# Patient Record
Sex: Male | Born: 1970 | Race: Black or African American | Hispanic: No | Marital: Single | Smoking: Never smoker
Health system: Southern US, Community
[De-identification: ages and names within clinical notes are randomized; demographics above are authoritative.]

## PROBLEM LIST (undated history)

## (undated) ENCOUNTER — Emergency Department (HOSPITAL_COMMUNITY): Admission: EM | Payer: Self-pay | Source: Home / Self Care | Attending: Family Medicine | Admitting: Family Medicine

---

## 1999-08-01 ENCOUNTER — Ambulatory Visit (HOSPITAL_COMMUNITY): Admission: RE | Admit: 1999-08-01 | Discharge: 1999-08-01 | Payer: Self-pay | Admitting: Family Medicine

## 1999-08-01 ENCOUNTER — Encounter: Payer: Self-pay | Admitting: Family Medicine

## 2009-01-16 ENCOUNTER — Emergency Department (HOSPITAL_COMMUNITY): Admission: EM | Admit: 2009-01-16 | Discharge: 2009-01-16 | Payer: Self-pay | Admitting: Family Medicine

## 2011-03-22 ENCOUNTER — Emergency Department (HOSPITAL_COMMUNITY): Payer: Self-pay

## 2011-03-22 ENCOUNTER — Emergency Department (HOSPITAL_COMMUNITY)
Admission: EM | Admit: 2011-03-22 | Discharge: 2011-03-22 | Disposition: A | Discharge status: Home / Self Care | Payer: Self-pay | Attending: Emergency Medicine | Admitting: Emergency Medicine

## 2011-03-22 ENCOUNTER — Inpatient Hospital Stay (INDEPENDENT_AMBULATORY_CARE_PROVIDER_SITE_OTHER)
Admission: RE | Admit: 2011-03-22 | Discharge: 2011-03-22 | Disposition: A | Discharge status: Home / Self Care | Payer: Self-pay | Source: Ambulatory Visit | Attending: Family Medicine | Admitting: Family Medicine

## 2011-03-22 DIAGNOSIS — R51 Headache: Secondary | ICD-10-CM | POA: Insufficient documentation

## 2011-03-22 DIAGNOSIS — R42 Dizziness and giddiness: Secondary | ICD-10-CM | POA: Insufficient documentation

## 2011-03-22 DIAGNOSIS — S065X9A Traumatic subdural hemorrhage with loss of consciousness of unspecified duration, initial encounter: Secondary | ICD-10-CM | POA: Insufficient documentation

## 2011-03-22 DIAGNOSIS — S060X9A Concussion with loss of consciousness of unspecified duration, initial encounter: Secondary | ICD-10-CM

## 2011-03-22 DIAGNOSIS — S06309A Unspecified focal traumatic brain injury with loss of consciousness of unspecified duration, initial encounter: Secondary | ICD-10-CM | POA: Insufficient documentation

## 2011-03-22 LAB — DIFFERENTIAL
Basophils Absolute: 0 10*3/uL (ref 0.0–0.1)
Basophils Relative: 0 % (ref 0–1)
Eosinophils Absolute: 0 10*3/uL (ref 0.0–0.7)
Eosinophils Relative: 0 % (ref 0–5)
Lymphocytes Relative: 23 % (ref 12–46)
Lymphs Abs: 2 10*3/uL (ref 0.7–4.0)
Monocytes Absolute: 0.6 10*3/uL (ref 0.1–1.0)
Monocytes Relative: 7 % (ref 3–12)
Neutro Abs: 6.1 10*3/uL (ref 1.7–7.7)
Neutrophils Relative %: 69 % (ref 43–77)

## 2011-03-22 LAB — COMPREHENSIVE METABOLIC PANEL
Albumin: 4.1 g/dL (ref 3.5–5.2)
Alkaline Phosphatase: 32 U/L — ABNORMAL LOW (ref 39–117)
BUN: 12 mg/dL (ref 6–23)
Creatinine, Ser: 1.03 mg/dL (ref 0.50–1.35)
GFR calc Af Amer: >60 mL/min (ref >60)
Glucose, Bld: 96 mg/dL (ref 70–99)
Total Bilirubin: 0.9 mg/dL (ref 0.3–1.2)
Total Protein: 8.2 g/dL (ref 6.0–8.3)

## 2011-03-22 LAB — CBC
HCT: 43.4 % (ref 39.0–52.0)
Hemoglobin: 15.3 g/dL (ref 13.0–17.0)
MCH: 28.4 pg (ref 26.0–34.0)
MCHC: 35.3 g/dL (ref 30.0–36.0)
MCV: 80.7 fL (ref 78.0–100.0)
Platelets: 275 10*3/uL (ref 150–400)
RBC: 5.38 MIL/uL (ref 4.22–5.81)
RDW: 13.6 % (ref 11.5–15.5)
WBC: 8.8 10*3/uL (ref 4.0–10.5)

## 2011-03-25 ENCOUNTER — Other Ambulatory Visit (HOSPITAL_COMMUNITY): Payer: Self-pay | Admitting: Emergency Medicine

## 2011-03-25 DIAGNOSIS — S0990XA Unspecified injury of head, initial encounter: Secondary | ICD-10-CM

## 2011-04-03 ENCOUNTER — Ambulatory Visit (HOSPITAL_COMMUNITY)
Admission: RE | Admit: 2011-04-03 | Discharge: 2011-04-03 | Disposition: A | Discharge status: Home / Self Care | Payer: Self-pay | Source: Ambulatory Visit | Attending: Emergency Medicine | Admitting: Emergency Medicine

## 2011-04-03 ENCOUNTER — Other Ambulatory Visit (HOSPITAL_COMMUNITY): Payer: Self-pay | Admitting: Emergency Medicine

## 2011-04-03 DIAGNOSIS — I6200 Nontraumatic subdural hemorrhage, unspecified: Secondary | ICD-10-CM | POA: Insufficient documentation

## 2011-04-03 DIAGNOSIS — S0990XA Unspecified injury of head, initial encounter: Secondary | ICD-10-CM

## 2011-10-05 ENCOUNTER — Ambulatory Visit: Payer: Self-pay | Admitting: Family Medicine

## 2011-10-05 VITALS — BP 117/74 | HR 64 | Temp 98.1°F | Resp 16 | Ht 68.0 in | Wt 191.0 lb

## 2011-10-05 DIAGNOSIS — Z0289 Encounter for other administrative examinations: Secondary | ICD-10-CM

## 2011-10-05 DIAGNOSIS — Z021 Encounter for pre-employment examination: Secondary | ICD-10-CM

## 2011-10-05 NOTE — Progress Notes (Signed)
  [facility]:  Office Visit  Chief Complaint:  Chief Complaint  Patient presents with  . Employment Physical    HPI: Arthur Aguirre is a 41 y.o. male who complains of  Here for a pre-employment physical. 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 Denies  CP, SOB wheezing with activites. Exercises regular 6x/week. No weakness, numbness, tingling, back issues or mental health issues.   History reviewed. No pertinent past medical history. History reviewed. No pertinent past surgical history. History   Social History  . Marital Status: Single    Spouse Name: N/A    Number of Children: N/A  . Years of Education: N/A   Social History Main Topics  . Smoking status: Never Smoker   . Smokeless tobacco: None  . Alcohol Use: Yes     social  . Drug Use: No  . Sexually Active: None   Other Topics Concern  . None   Social History Narrative  . None   Family History  Problem Relation Age of Onset  . Cancer Mother    Allergies no known allergies Prior to Admission medications   Not on File     ROS: The patient denies fevers, chills, night sweats, unintentional weight loss, chest pain, palpitations, wheezing, dyspnea on exertion, nausea, vomiting, abdominal pain, dysuria, hematuria, melena, numbness, weakness, or tingling.  Denies any thoughts of SI/HI.   All other systems have been reviewed and were otherwise negative with the exception of those mentioned in the HPI and as above.    PHYSICAL EXAM: Filed Vitals:   10/05/11 0955  BP: 117/74  Pulse: 64  Temp: 98.1 F (36.7 C)  Resp: 16   Filed Vitals:   10/05/11 0955  Height: 5\' 8"  (1.727 m)  Weight: 191 lb (86.637 kg)   Body mass index is 29.04 kg/(m^2).  General: Alert, no acute distress.  HEENT:  Normocephalic, atraumatic, oropharynx patent. TM nl. No exudates. PERRLA, EOMI Cardiovascular:  Regular rate and rhythm, no rubs murmurs or gallops.  No Carotid bruits,  radial pulse intact. No pedal edema.  Respiratory: Clear to auscultation bilaterally.  No wheezes, rales, or rhonchi.  No cyanosis, no use of accessory musculature GI: No organomegaly, abdomen is soft and non-tender, positive bowel sounds.  No masses. Skin: No rashes. Neurologic: Facial musculature symmetric. Psychiatric: Patient is appropriate throughout our interaction.  Lymphatic: No cervical lymphadenopathy Musculoskeletal: Gait intact. Msk normal. No scoliosis. 5/5 strength. 2/2 DTRs   LABS:  EKG/XRAY:   Primary read interpreted by Dr. Conley Rolls at [facility].   ASSESSMENT/PLAN: Encounter Diagnosis  Name Primary?  . Physical exam, pre-employment Yes   Patient has no physical restrictions to participate in the training program for the [facility]    Hamilton Capri [facility], DO 10/06/2011 1:14 PM

## 2012-06-09 ENCOUNTER — Ambulatory Visit: Payer: Self-pay | Admitting: Internal Medicine

## 2012-06-09 VITALS — BP 120/86 | HR 77 | Temp 98.0°F | Resp 16 | Ht 67.0 in | Wt 194.0 lb

## 2012-06-09 DIAGNOSIS — L723 Sebaceous cyst: Secondary | ICD-10-CM

## 2012-06-09 DIAGNOSIS — B360 Pityriasis versicolor: Secondary | ICD-10-CM

## 2012-06-09 MED ORDER — KETOCONAZOLE 200 MG PO TABS: 200.0000 mg | ORAL_TABLET | Freq: Every day | ORAL | Status: DC

## 2012-06-09 MED ORDER — KETOCONAZOLE 2 % EX SHAM: MEDICATED_SHAMPOO | CUTANEOUS | Status: DC

## 2012-06-09 NOTE — Progress Notes (Signed)
  Subjective:    Patient ID: Arthur Aguirre, male    DOB: 1971/03/21, 41 y.o.   MRN: [id number]  HPI Has a cyst on his back for last 2 moths and never had before. Also has skin rash over shoulders, spotting and has had before. Exercises and sweats a lot   Review of Systems     Objective:   Physical Exam        Assessment & Plan:  Tinea versicolor Cyst back/sebaceous expressed--Rec derm consult, # given Nizoral shampoo/ketoconozole po weekly x3

## 2012-06-09 NOTE — Patient Instructions (Signed)
Tinea Versicolor  Tinea versicolor is a common yeast infection of the skin. This condition becomes known when the yeast on our skin starts to overgrow (yeast is a normal inhabitant on our skin). This condition is noticed as white or light brown patches on brown skin, and is more evident in the summer on tanned skin. These areas are slightly scaly if scratched. The light patches from the yeast become evident when the yeast creates "holes in your suntan". This is most often noticed in the summer. The patches are usually located on the chest, back, pubis, neck and body folds. However, it may occur on any area of body. Mild itching and inflammation (redness or soreness) may be present.  DIAGNOSIS   The diagnosisof this is made clinically (by looking). Cultures from samples are usually not needed. Examination under the microscope may help. However, yeast is normally found on skin. The diagnosis still remains clinical. Examination under Wood's Ultraviolet Light can determine the extent of the infection.  TREATMENT   This common infection is usually only of cosmetic (only a concern to your appearance). It is easily treated with dandruff shampoo used during showers or bathing. Vigorous scrubbing will eliminate the yeast over several days time. The light areas in your skin may remain for weeks or months after the infection is cured unless your skin is exposed to sunlight. The lighter or darker spots caused by the fungus that remain after complete treatment are not a sign of treatment failure; it will take a long time to resolve. Your caregiver may recommend a number of commercial preparations or medication by mouth if home care is not working. Recurrence is common and preventative medication may be necessary.  This skin condition is not highly contagious. Special care is not needed to protect close friends and family members. Normal hygiene is usually enough. Follow up is required only if you develop complications (such as a  secondary infection from scratching), if recommended by your caregiver, or if no relief is obtained from the preparations used.  Document Released: 06/19/2000 Document Revised: 09/14/2011 Document Reviewed: 08/01/2008  [employer] Patient Information 2013 [employer].

## 2012-07-31 IMAGING — CT CT HEAD W/O CM
1 of 3 series · 15 of 30 positions shown, 19 images · non-contrast
Comparison: 03/22/2011.

CLINICAL DATA: 40-year-old male with head injury 2 weeks ago
following blunt trauma.  Subdural hematoma.

CT HEAD WITHOUT CONTRAST
TECHNIQUE: Contiguous axial images were obtained from the base of
the skull through the vertex without contrast.

[Series 5: head routine 4.8 h37s · axial · 0.50mm/px · z∈[+1286,+1412]mm · 15 of 30 slices shown, 19 images]
[im 2/30  brain]
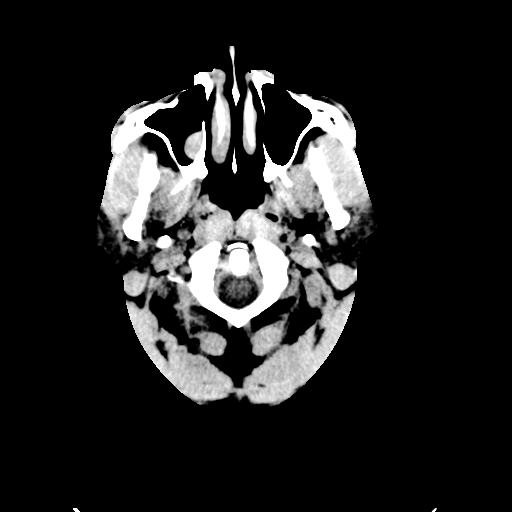
[im 2/30  bone]
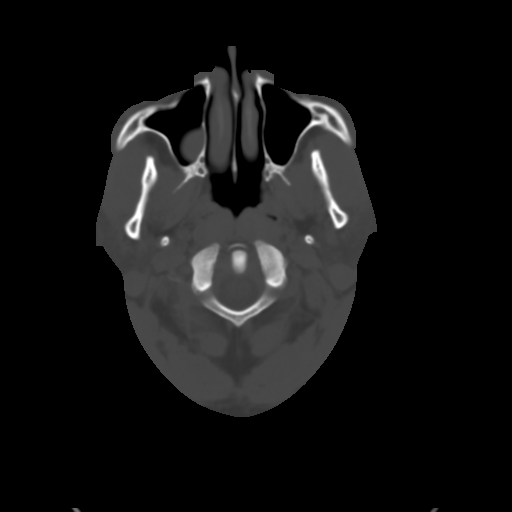
[im 3/30  brain]
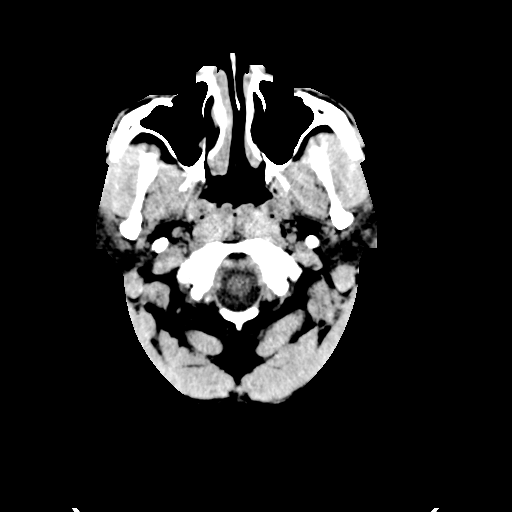
[im 6/30  brain]
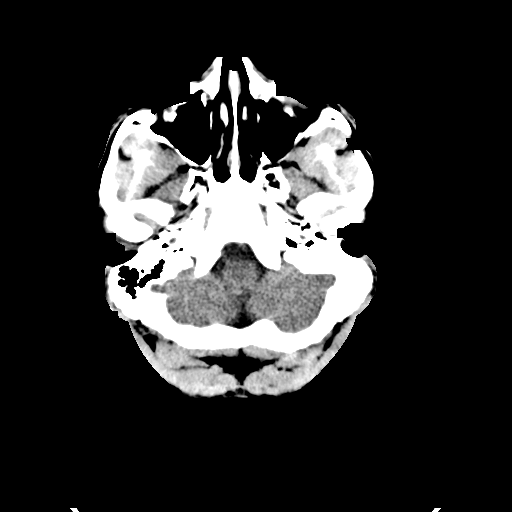
[im 8/30  brain]
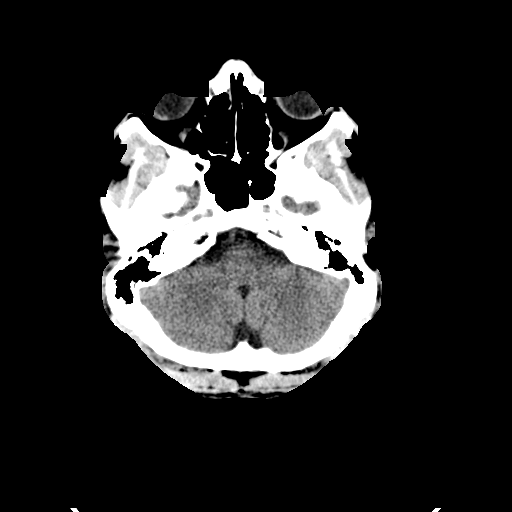
[im 9/30  brain]
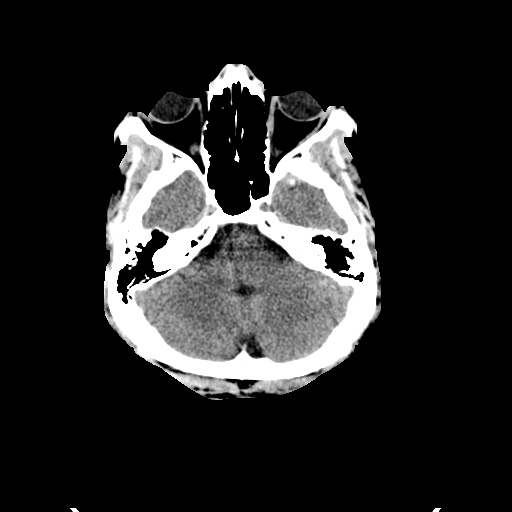
[im 9/30  bone]
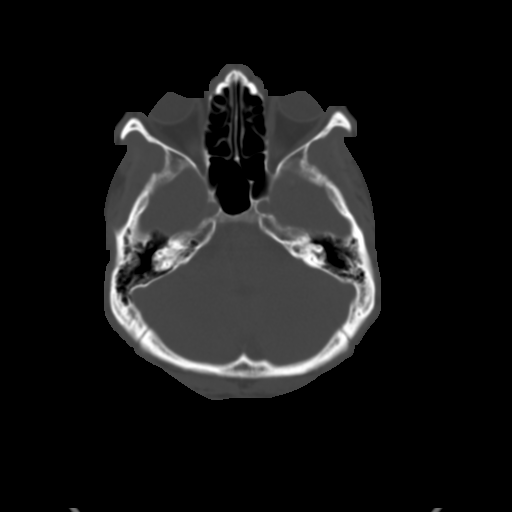
[im 11/30  brain]
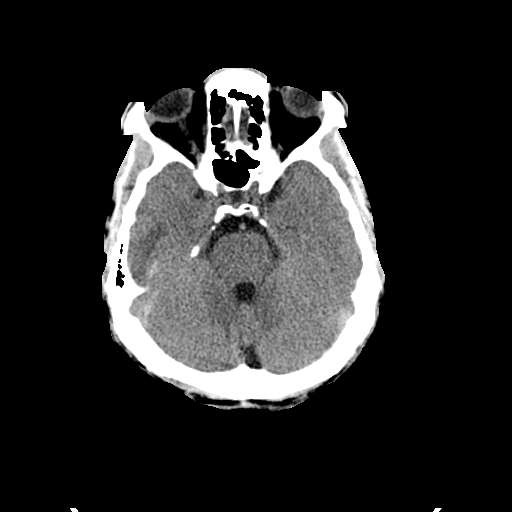
[im 14/30  brain]
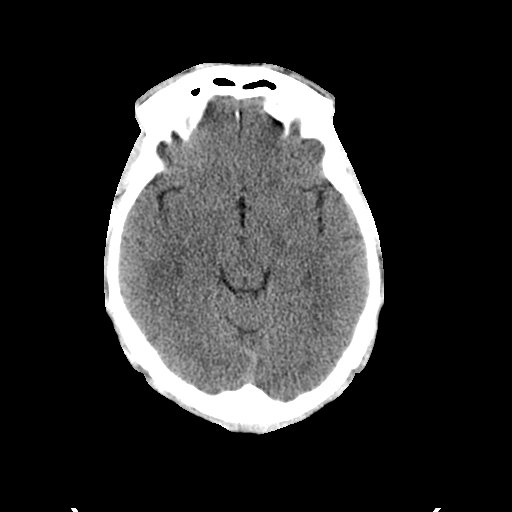
[im 15/30  brain]
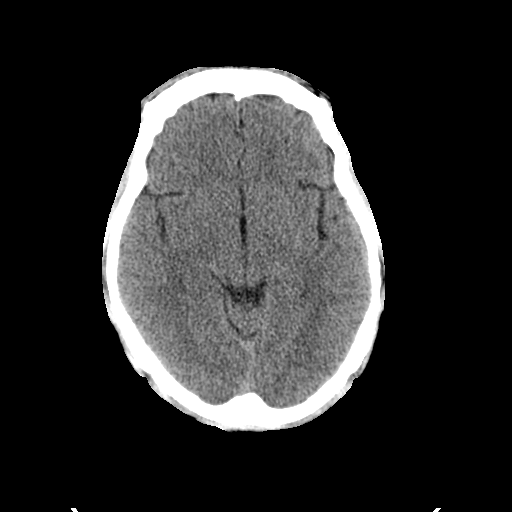
[im 16/30  brain]
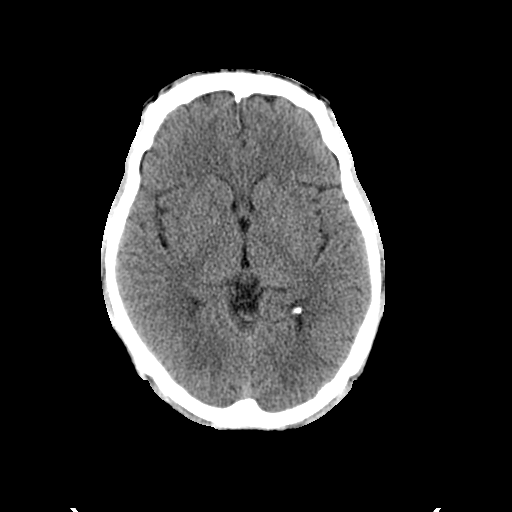
[im 16/30  bone]
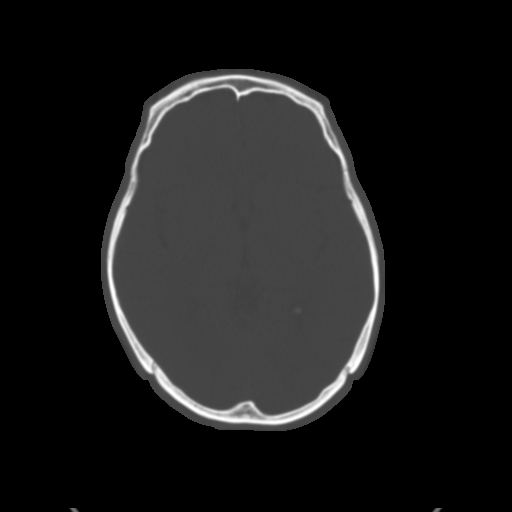
[im 19/30  brain]
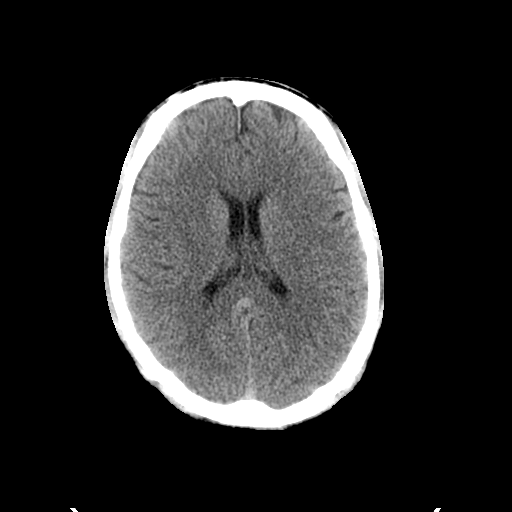
[im 21/30  brain]
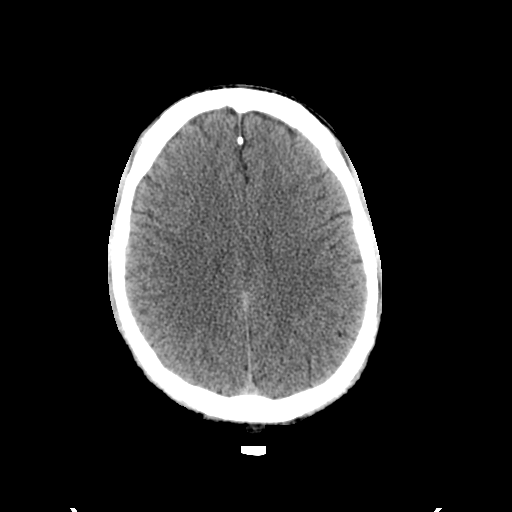
[im 22/30  brain]
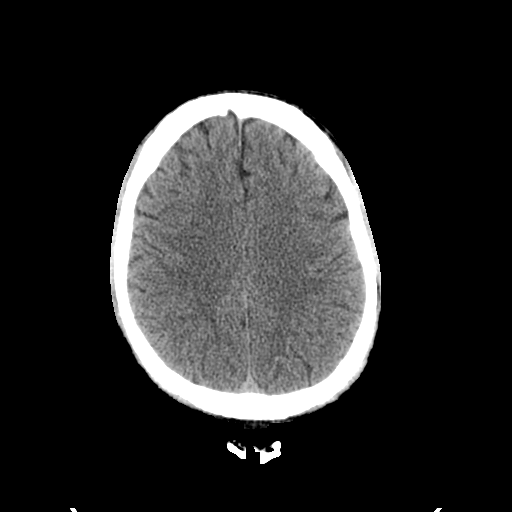
[im 24/30  brain]
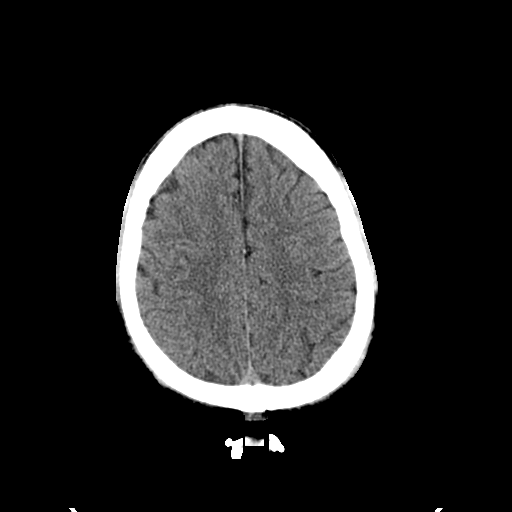
[im 24/30  bone]
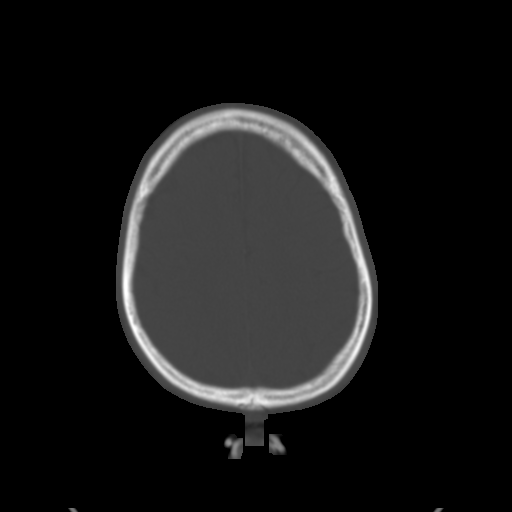
[im 27/30  brain]
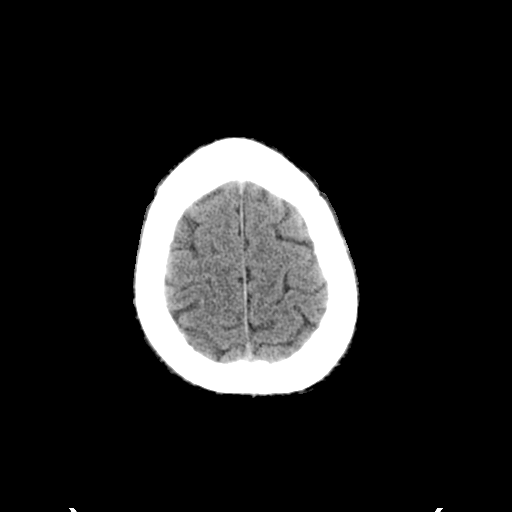
[im 28/30  brain]
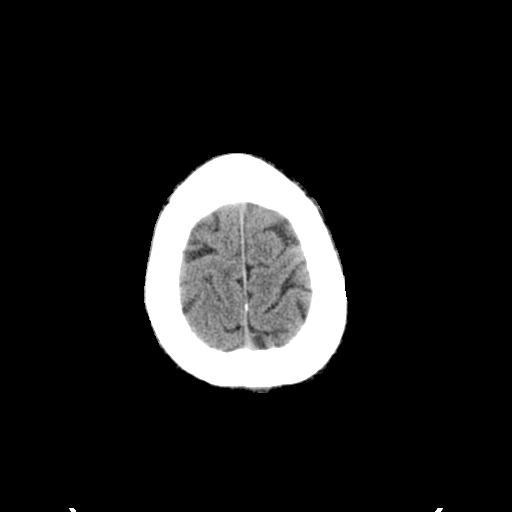

[15 of 30 positions shown; findings below may reference images not displayed]

FINDINGS: Interval expected evolution of right posterior temporal
lobe contusion.  Hypodensity in that area.  No associated mass
effect.  Basilar cisterns remain patent.  No ventriculomegaly or
intraventricular hemorrhage.  No residual extra-axial hemorrhage
identified.

Stable, normal gray-white matter differentiation elsewhere. No
evidence of cortically based acute infarction identified.  No
suspicious intracranial vascular hyperdensity.

Visualized orbits and scalp soft tissues are within normal limits.
Mucous retention cyst right maxillary sinus.  Other Visualized
paranasal sinuses and mastoids are clear.  No acute osseous
abnormality identified.
IMPRESSION: 1.  Interval resolved small subdural hematoma and expected
evolution of right posterior temporal lobe contusion.
2.  No new intracranial abnormality.

## 2016-10-10 ENCOUNTER — Ambulatory Visit (INDEPENDENT_AMBULATORY_CARE_PROVIDER_SITE_OTHER): Payer: Self-pay | Admitting: Urgent Care

## 2016-10-10 VITALS — BP 125/77 | HR 70 | Temp 97.8°F | Resp 18 | Ht 67.75 in | Wt 173.4 lb

## 2016-10-10 DIAGNOSIS — Z973 Presence of spectacles and contact lenses: Secondary | ICD-10-CM

## 2016-10-10 DIAGNOSIS — Z024 Encounter for examination for driving license: Secondary | ICD-10-CM

## 2016-10-10 NOTE — Progress Notes (Signed)
  Airline pilot Medical Examination   Arthur Aguirre is a 46 y.o. 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 Denies dizziness, chronic headache, blurred vision, chest pain, shortness of breath, heart racing, palpitations, nausea, vomiting, abdominal pain, hematuria, lower leg swelling. Drinks alcohol once a month. Denies smoking cigarettes.  The following portions of the patient's history were reviewed and updated as appropriate: allergies, current medications, past family history, past medical history, past social history and past surgical history.  Objective:   BP 125/77   Pulse 70   Temp 97.8 F (36.6 C) (Oral)   Resp 18   Ht 5' 7.75" (1.721 m)   Wt 173 lb 6 oz (78.6 kg)   SpO2 96%   BMI 26.56 kg/m   Vision/hearing:  Visual Acuity Screening   Right eye Left eye Both eyes  Without correction:     With correction:  Comments: Horizontal FOV: 70 degrees (OU) Colors: 5/5 (passed)  Hearing Screening Comments: Whisper Test: Passed (29ft-AU)  Patient can recognize and distinguish among traffic control signals and devices showing standard red, green, and amber colors.  Corrective lenses required: Yes  Monocular Vision?: No  Hearing aid requirement: No  Physical Exam  Constitutional: He is oriented to person, place, and time. He appears well-developed and well-nourished.  HENT:  TM's intact bilaterally, no effusions or erythema. Nasal turbinates pink and moist, nasal passages patent. No sinus tenderness. Oropharynx clear, mucous membranes moist, dentition in good repair.  Eyes: Conjunctivae and EOM are normal. Pupils are equal, round, and reactive to light. Right eye exhibits no discharge. Left eye exhibits no discharge. No scleral icterus.  Neck: Normal range of motion. Neck supple.  Cardiovascular: Normal rate, regular rhythm and intact distal pulses.  Exam reveals no gallop and no friction rub.   No murmur heard. Pulmonary/Chest: No  stridor. No respiratory distress. He has no wheezes. He has no rales.  Abdominal: Soft. Bowel sounds are normal. He exhibits no distension and no mass. There is no tenderness.  Musculoskeletal: Normal range of motion. He exhibits no edema or tenderness.  Lymphadenopathy:    He has no cervical adenopathy.  Neurological: He is alert and oriented to person, place, and time. He has normal reflexes. He displays normal reflexes. Coordination normal.  Skin: Skin is warm and dry. Capillary refill takes less than 2 seconds. No rash noted. No erythema. No pallor.  Psychiatric: He has a normal mood and affect.   Labs: Comments: Urinalysis: SG: 1.020, Protein: Negative, Blood: Negative, Sugar: Negative  Assessment:    Healthy male exam.  Meets standards in 64 CFR 391.41;  qualifies for 2 year certificate.    Plan:   Medical examiners certificate completed and printed. Return as needed.  Wallis Bamberg, PA-C [facility] [phone] 10/10/2016  12:02 PM

## 2018-10-05 ENCOUNTER — Ambulatory Visit: Payer: Self-pay | Admitting: Family Medicine
